# Patient Record
Sex: Female | Born: 1990 | Race: Black or African American | Hispanic: No | Marital: Single
Health system: Midwestern US, Community
[De-identification: ages and names within clinical notes are randomized; demographics above are authoritative.]

## PROBLEM LIST (undated history)

## (undated) DIAGNOSIS — R634 Abnormal weight loss: Secondary | ICD-10-CM

## (undated) DIAGNOSIS — R1013 Epigastric pain: Secondary | ICD-10-CM

## (undated) DIAGNOSIS — D649 Anemia, unspecified: Secondary | ICD-10-CM

## (undated) DIAGNOSIS — K589 Irritable bowel syndrome without diarrhea: Secondary | ICD-10-CM

## (undated) HISTORY — PX: CHOLECYSTECTOMY: SHX55

## (undated) HISTORY — PX: OTHER SURGICAL HISTORY: SHX169

---

## 2010-12-05 LAB — URINALYSIS W/ RFLX MICROSCOPIC
Bilirubin: NEGATIVE
Blood: NEGATIVE
Glucose: NEGATIVE MG/DL
Nitrites: POSITIVE — AB
Protein: NEGATIVE MG/DL
Specific gravity: >1.03 — ABNORMAL HIGH (ref 1.003–1.030)
Urobilinogen: 0.2 EU/DL (ref 0.2–1.0)
pH (UA): 6 (ref 5.0–8.0)

## 2010-12-05 LAB — CBC WITH AUTOMATED DIFF
ABS. BASOPHILS: 0 10*3/uL (ref 0.0–0.06)
ABS. EOSINOPHILS: 0.3 10*3/uL (ref 0.0–0.4)
ABS. LYMPHOCYTES: 0.8 10*3/uL — ABNORMAL LOW (ref 0.9–3.6)
ABS. MONOCYTES: 0.6 10*3/uL (ref 0.05–1.2)
ABS. NEUTROPHILS: 5.2 10*3/uL (ref 1.8–8.0)
BASOPHILS: 0 % (ref 0–2)
EOSINOPHILS: 5 % (ref 0–5)
HCT: 28.8 % — ABNORMAL LOW (ref 35.0–45.0)
HGB: 8.8 g/dL — ABNORMAL LOW (ref 12.0–16.0)
LYMPHOCYTES: 11 % — ABNORMAL LOW (ref 21–52)
MCH: 19.1 PG — ABNORMAL LOW (ref 24.0–34.0)
MCHC: 30.6 g/dL — ABNORMAL LOW (ref 31.0–37.0)
MCV: 62.6 FL — ABNORMAL LOW (ref 74.0–97.0)
MONOCYTES: 8 % (ref 3–10)
MPV: 9 FL — ABNORMAL LOW (ref 9.2–11.8)
NEUTROPHILS: 76 % — ABNORMAL HIGH (ref 40–73)
PLATELET COMMENTS: INCREASED
PLATELET: 409 10*3/uL (ref 135–420)
RBC: 4.6 M/uL (ref 4.20–5.30)
RDW: 19 % — ABNORMAL HIGH (ref 11.6–14.5)
WBC: 6.9 10*3/uL (ref 4.6–13.2)

## 2010-12-05 LAB — METABOLIC PANEL, BASIC
Anion gap: 5 mmol/L (ref 5–15)
BUN/Creatinine ratio: 9 — ABNORMAL LOW (ref 12–20)
BUN: 7 MG/DL (ref 7–18)
CO2: 27 MMOL/L (ref 21–32)
Calcium: 8.9 MG/DL (ref 8.4–10.4)
Chloride: 103 MMOL/L (ref 100–108)
Creatinine: 0.8 MG/DL (ref 0.6–1.3)
GFR est AA: >60 mL/min/{1.73_m2} (ref >60)
GFR est non-AA: >60 mL/min/{1.73_m2} (ref >60)
Glucose: 88 MG/DL (ref 74–99)
Potassium: 3.7 MMOL/L (ref 3.5–5.5)
Sodium: 135 MMOL/L — ABNORMAL LOW (ref 136–145)

## 2010-12-05 LAB — HEPATIC FUNCTION PANEL
A-G Ratio: 0.6 — ABNORMAL LOW (ref 0.8–1.7)
ALT (SGPT): 20 U/L — ABNORMAL LOW (ref 30–65)
AST (SGOT): 14 U/L — ABNORMAL LOW (ref 15–37)
Albumin: 3.3 g/dL — ABNORMAL LOW (ref 3.4–5.0)
Alk. phosphatase: 92 U/L (ref 50–136)
Bilirubin, direct: 0.2 MG/DL (ref 0.0–0.2)
Bilirubin, total: 0.4 MG/DL (ref 0.2–1.0)
Globulin: 5.5 g/dL — ABNORMAL HIGH (ref 2.0–4.0)
Protein, total: 8.8 g/dL — ABNORMAL HIGH (ref 6.4–8.2)

## 2010-12-05 LAB — URINE MICROSCOPIC ONLY
RBC: 0 /HPF (ref 0–5)
WBC: 11 /HPF (ref 0–4)

## 2010-12-05 LAB — HCG URINE, QL: HCG urine, QL: NEGATIVE

## 2010-12-05 LAB — LIPASE: Lipase: 62 U/L — ABNORMAL LOW (ref 73–393)

## 2010-12-07 LAB — CULTURE, URINE
Culture result:: >100000
Culture: >100000

## 2011-02-02 ENCOUNTER — Encounter

## 2011-02-15 LAB — METABOLIC PANEL, BASIC
Anion gap: 3 mmol/L — ABNORMAL LOW (ref 5–15)
BUN/Creatinine ratio: 14 (ref 12–20)
BUN: 10 MG/DL (ref 7–18)
CO2: 30 MMOL/L (ref 21–32)
Calcium: 8.9 MG/DL (ref 8.4–10.4)
Chloride: 103 MMOL/L (ref 100–108)
Creatinine: 0.7 MG/DL (ref 0.6–1.3)
GFR est AA: >60 mL/min/{1.73_m2} (ref >60)
GFR est non-AA: >60 mL/min/{1.73_m2} (ref >60)
Glucose: 77 MG/DL (ref 74–99)
Potassium: 4.9 MMOL/L (ref 3.5–5.5)
Sodium: 136 MMOL/L (ref 136–145)

## 2011-02-15 LAB — CBC W/O DIFF
HCT: 28.3 % — ABNORMAL LOW (ref 35.0–45.0)
HGB: 8.4 g/dL — ABNORMAL LOW (ref 12.0–16.0)
MCH: 18.9 PG — ABNORMAL LOW (ref 24.0–34.0)
MCHC: 29.7 g/dL — ABNORMAL LOW (ref 31.0–37.0)
MCV: 63.7 FL — ABNORMAL LOW (ref 74.0–97.0)
MPV: 9.3 FL (ref 9.2–11.8)
PLATELET: 419 10*3/uL (ref 135–420)
RBC: 4.44 M/uL (ref 4.20–5.30)
RDW: 20.3 % — ABNORMAL HIGH (ref 11.6–14.5)
WBC: 4.7 10*3/uL (ref 4.6–13.2)

## 2011-03-02 LAB — METABOLIC PANEL, BASIC
Anion gap: 7 mmol/L (ref 5–15)
BUN/Creatinine ratio: 15 (ref 12–20)
BUN: 12 MG/DL (ref 7–18)
CO2: 30 MMOL/L (ref 21–32)
Calcium: 9 MG/DL (ref 8.4–10.4)
Chloride: 101 MMOL/L (ref 100–108)
Creatinine: 0.8 MG/DL (ref 0.6–1.3)
GFR est AA: >60 mL/min/{1.73_m2} (ref >60)
GFR est non-AA: >60 mL/min/{1.73_m2} (ref >60)
Glucose: 74 MG/DL (ref 74–99)
Potassium: 4.7 MMOL/L (ref 3.5–5.5)
Sodium: 138 MMOL/L (ref 136–145)

## 2011-03-02 LAB — CBC W/O DIFF
HCT: 28.6 % — ABNORMAL LOW (ref 35.0–45.0)
HGB: 8.4 g/dL — ABNORMAL LOW (ref 12.0–16.0)
MCH: 19.1 PG — ABNORMAL LOW (ref 24.0–34.0)
MCHC: 29.4 g/dL — ABNORMAL LOW (ref 31.0–37.0)
MCV: 65.1 FL — ABNORMAL LOW (ref 74.0–97.0)
MPV: 9.4 FL (ref 9.2–11.8)
PLATELET: 397 10*3/uL (ref 135–420)
RBC: 4.39 M/uL (ref 4.20–5.30)
RDW: 20.9 % — ABNORMAL HIGH (ref 11.6–14.5)
WBC: 4.7 10*3/uL (ref 4.6–13.2)

## 2011-03-02 NOTE — H&P (Unsigned)
[facility]                               [address] [place], [place] [address]                                   PRE-ADMISSION                             HISTORY AND PHYSICAL    PATIENT:    Meredith Lewis, Meredith Lewis  MRN:           [id number]     DATE:   03/03/2011  BILLING:       [id number]    ROOM:   Milas Kocher  DICTATING:  Kaelee Pfeffer G. Aloise Copus, MD      CHIEF COMPLAINT: Mid epigastric pain since December of 2010.    HISTORY OF PRESENT ILLNESS: This 20 year old patient presents with  epigastric and mid epigastric pain since December 2010, coming on and off,  described as burning, no radiation and worse with any kind of food,  ________nausea and vomiting and occurring mostly postprandially. The  patient has  been worked up by Dr. Reesa Chew and so far no documentation of  GI abnormality and gallbladder ultrasound showed gallstones. There is no  fever or jaundice. A HIDA scan showed a 22% ejection fraction. A diagnosis  of chronic cholecystitis and cholelithiasis, symptomatic, was made and the  patient was referred for surgical removal of her gallbladder. A single  incision laparoscopic cholecystectomy was discussed with her with possible  risks and complications not to exclude bleeding, possible bile duct injury,  possible bowel injury, CO2 insufflation complication, possible conversion  to a standard laparoscopic procedure or open cholecystectomy. She  understood and agreed to proceed.    ALLERGIES: NONE TO ANY DRUGS.    PAST MEDICAL HISTORY: Denies diabetes mellitus, no hypertension, no seizure  history, no asthma, bleeding history.    SOCIAL HISTORY: No alcohol or smoking history.    MEDICATIONS: None.    OCCUPATION: A Archivist at [employer].    FAMILY HISTORY: Denies any gallbladder disease in her family.    PHYSICAL EXAMINATION  GENERAL: The patient is normally developed, fairly nourished, without any   respiratory distress. She is alert, awake and oriented.  HEENT: Within normal limits. ________.  NECK: Supple. No adenopathy felt. No thyromegaly.  CHEST: Clear anteriorly and posteriorly to both sides, right and left.  HEART: Regular rate and rhythm.  ABDOMEN: Flat and soft. There is mild pain and tenderness in the right  upper quadrant. No organomegaly. Bowel sounds are present.  EXTREMITIES: Shows no motor or sensory deficit.    IMPRESSION: Chronic cholecystitis with cholelithiasis.    PLAN: As discussed above.                          Date:______Time:______Signature________________________________          Arlice Colt. Sharmon Revere, MD    RGP:wmx  D: 03/02/2011  11:22 P  T: 03/02/2011  11:51 P  Job: [id number]  CScriptDoc #: [id number]    cc:    Philemon Kingdom, MD  Rannie Craney G. Sharmon Revere, MD

## 2011-03-03 LAB — TYPE & SCREEN
ABO/Rh(D): O POS
Antibody screen: NEGATIVE

## 2011-03-03 LAB — TYPE AND SCREEN
ABO/Rh: O POS
Antibody Screen: NEGATIVE

## 2011-03-04 NOTE — Op Note (Unsigned)
[facility]                               [address] [place], [place] [address]                                 OPERATIVE REPORT    PATIENT:     Meredith Lewis, Meredith Lewis  MRN:            [id number]    DATE:   03/03/2011  BILLING:     [id number]  ROOM:        Milas Kocher  ATTENDING:   Arlice Colt. Sharmon Revere, MD  DICTATING:   Sriansh Farra G. Rashawn Rolon, MD      PROCEDURE: Single incision laparoscopic cholecystectomy.    PREOPERATIVE DIAGNOSIS: Chronic cholecystitis and cholelithiasis,  symptomatic.    POSTOPERATIVE DIAGNOSIS: Chronic cholecystitis and cholelithiasis,  symptomatic. Pending pathology.    FINDINGS: Presence of a distended gallbladder, partially intrahepatic, with  multiple stones/black stones inside the gallbladder.    COUNTS: Correct.    ANESTHESIA: General given by nurse anesthetist under Dr. Sedonia Small  supervision and Marcaine 0.25% local infiltration.    DESCRIPTION OF PROCEDURE: With the patient in supine position under general  anesthesia, a timeout was called recording the patient's name, surgeon  performing the operation, the procedure being done, allergies and  medications, and everybody concurred and agreed to proceed. The abdomen was  then prepped with Betadine and draped in sterile fashion. Marcaine was  infiltrated into the navel.    An incision was made longitudinally, releasing the navel from the natural  orifice. A suture was then placed on both sides of natural orifice using 0  Vicryl on a UR needle and the natural orifice was extended to about 2.5 cm.  Through this opening, the Alexis portion of the GelPort was introduced. The  outer plastic ring was folded inwards, flush to the skin. Plastic trough  was placed in the gel portion was placed on top with two 10-mm ports on the  left, one on the right and one in the middle. CO2 insufflation was then  started. A sealed camera was placed in the right lateral port. Grasping the   gallbladder from the dome, it was then evident that the gallbladder was  partially intrahepatic. Another grasper was placed at the body of the  gallbladder and with careful dissection the cystic duct was exposed,  releasing leaflet of Calot with the cystic artery.    The cystic artery was divided as it entered the gallbladder using Harmonic  scalpel at level 2 with excellent division and coagulation and needed more  dissection. The cystic duct was exposed well and this was divided as it  leaves the gallbladder using Harmonic scalpel at level 2.    The gallbladder was then further dissected some more to gain good exposure  of the cystic duct and cystic duct area.  The cystic duct was then endolooped with 2-0 PDS and the suture was divided  long. The gallbladder was then put in slight traction was excised from the  gallbladder bed using a Harmonic scalpel at level 2 and level 5.    The gallbladder wall was excised completely without any evidence of oozing  or  bleeding in the gallbladder bed region. The area was irrigated and all  fluid used to irrigate was suctioned out.    The gallbladder was then removed the incision itself without much  difficulty. The CO2 was stopped and allowed to escape. The wound was  infiltrated with Marcaine 0.5% plain, followed by closure of the wound with  ________ in figure-of-eight manner and the navel was then sutured back to  the rectus fascia with 2-0 Vicryl and followed by bacitracin, 2 x 2 and  Tegaderm. The patient has tolerated the procedure well.                        Date:______Time:______Signature________________________________          Arlice Colt. Sharmon Revere, MD    RGP:wmx  D: 03/03/2011 T: 03/04/2011  3:46 A  Job: [id number]  CScriptDoc #: [id number]    cc:     Caroleen Hamman, MD          Ella Golomb G. Sharmon Revere, MD

## 2011-05-31 ENCOUNTER — Encounter

## 2011-06-08 MED ORDER — BARIUM SULFATE 96 % (W/W) ORAL SUSP, RECON
96 % (w/w) | Freq: Once | ORAL | Status: AC
Start: 2011-06-08 — End: 2011-06-08
  Administered 2011-06-08: 11:00:00 via ORAL

## 2011-06-16 ENCOUNTER — Encounter

## 2011-06-21 ENCOUNTER — Encounter

## 2011-06-21 MED ORDER — IOVERSOL 320 MG/ML IV SOLN
320 mg iodine/mL | Freq: Once | INTRAVENOUS | Status: AC
Start: 2011-06-21 — End: 2011-06-21
  Administered 2011-06-21: 15:00:00 via INTRAVENOUS

## 2011-08-20 MED ADMIN — diphenhydrAMINE (BENADRYL) capsule 25 mg: ORAL | @ 15:00:00 | NDC 00904530661

## 2011-08-20 MED ADMIN — sodium chloride (NS) flush 5-10 mL: INTRAVENOUS | @ 15:00:00 | NDC 87701099893

## 2011-08-20 MED ADMIN — acetaminophen (TYLENOL) tablet 650 mg: ORAL | @ 15:00:00 | NDC 50580050130

## 2011-08-20 MED ADMIN — 0.9% sodium chloride infusion 250 mL: INTRAVENOUS | @ 15:00:00 | NDC 00409798302

## 2011-08-20 MED ADMIN — sodium chloride (NS) flush 5-10 mL: INTRAVENOUS | @ 20:00:00 | NDC 87701099893

## 2011-08-20 NOTE — Progress Notes (Signed)
[facility] Progress Note    Date: August 20, 2011    Name: Meredith Lewis    MRN: [id number]         DOB: 01-Jan-1991      Patient assessed and education was provided.     Patient vitals were reviewed.  Visit Vitals   Item Reading   ??? BP 112/75   ??? Pulse 67   ??? Temp 98.2 ??F (36.8 ??C)   ??? Resp 18   ??? SpO2 100%       Pre-medications were administered as ordered and transfusion  was initiated.       Patient  tolerated  transfusion, and has no complaints at this time. Discharge instruction was given and patient     verbalized understanding.    [facility] at  1500.       Lanny Cramp  August 20, 2011  2:55 PM

## 2011-08-21 LAB — TYPE + CROSSMATCH
ABO/Rh(D): O POS
Antibody screen: NEGATIVE
Status of unit: TRANSFUSED
Status of unit: TRANSFUSED
Unit division: 0
Unit division: 0

## 2013-12-28 LAB — URINALYSIS W/ RFLX MICROSCOPIC
Bilirubin: NEGATIVE
Blood: NEGATIVE
Glucose: NEGATIVE mg/dL
Ketone: NEGATIVE mg/dL
Leukocyte Esterase: NEGATIVE
Nitrites: NEGATIVE
Protein: 30 mg/dL — AB
Specific gravity: >1.03 — ABNORMAL HIGH (ref 1.003–1.030)
Urobilinogen: 0.2 EU/dL (ref 0.2–1.0)
pH (UA): 5.5 (ref 5.0–8.0)

## 2013-12-28 LAB — CBC WITH AUTOMATED DIFF
ABS. BASOPHILS: 0 10*3/uL (ref 0.0–0.1)
ABS. EOSINOPHILS: 0.1 10*3/uL (ref 0.0–0.4)
ABS. LYMPHOCYTES: 1.7 10*3/uL (ref 0.9–3.6)
ABS. MONOCYTES: 0.3 10*3/uL (ref 0.05–1.2)
ABS. NEUTROPHILS: 4.1 10*3/uL (ref 1.8–8.0)
BASOPHILS: 1 % (ref 0–2)
EOSINOPHILS: 1 % (ref 0–5)
HCT: 28.7 % — ABNORMAL LOW (ref 35.0–45.0)
HGB: 9.3 g/dL — ABNORMAL LOW (ref 12.0–16.0)
LYMPHOCYTES: 27 % (ref 21–52)
MCH: 26.6 PG (ref 24.0–34.0)
MCHC: 32.4 g/dL (ref 31.0–37.0)
MCV: 82.2 FL (ref 74.0–97.0)
MONOCYTES: 4 % (ref 3–10)
MPV: 10.1 FL (ref 9.2–11.8)
NEUTROPHILS: 67 % (ref 40–73)
PLATELET: 321 10*3/uL (ref 135–420)
RBC: 3.49 M/uL — ABNORMAL LOW (ref 4.20–5.30)
RDW: 14.3 % (ref 11.6–14.5)
WBC: 6.1 10*3/uL (ref 4.6–13.2)

## 2013-12-28 LAB — URINE MICROSCOPIC ONLY
Bacteria: NEGATIVE /hpf
RBC: NEGATIVE /hpf (ref 0–5)
WBC: 4 /hpf (ref 0–4)

## 2013-12-28 LAB — TYPE & SCREEN
ABO/Rh(D): O POS
Antibody screen: NEGATIVE

## 2013-12-28 LAB — METABOLIC PANEL, BASIC
Anion gap: 9 mmol/L (ref 3.0–18)
BUN/Creatinine ratio: 13 (ref 12–20)
BUN: 12 MG/DL (ref 7.0–18)
CO2: 23 mmol/L (ref 21–32)
Calcium: 8.5 MG/DL (ref 8.5–10.1)
Chloride: 103 mmol/L (ref 100–108)
Creatinine: 0.9 MG/DL (ref 0.6–1.3)
GFR est AA: >60 mL/min/{1.73_m2} (ref >60)
GFR est non-AA: >60 mL/min/{1.73_m2} (ref >60)
Glucose: 139 mg/dL — ABNORMAL HIGH (ref 74–99)
Potassium: 3.6 mmol/L (ref 3.5–5.5)
Sodium: 135 mmol/L — ABNORMAL LOW (ref 136–145)

## 2013-12-28 LAB — PTT: aPTT: 25.2 s (ref 24.6–37.7)

## 2013-12-28 LAB — PROTHROMBIN TIME + INR
INR: 1 (ref 0.8–1.2)
Prothrombin time: 13.6 s (ref 11.5–15.2)

## 2013-12-28 LAB — BETA HCG, QT
Beta HCG, QT: 979 m[IU]/mL — ABNORMAL HIGH (ref 0–10)
hCG Quant: 979 m[IU]/mL — ABNORMAL HIGH (ref 0–10)

## 2013-12-28 LAB — TYPE AND SCREEN
ABO/Rh: O POS
Antibody Screen: NEGATIVE

## 2013-12-28 MED ADMIN — sodium chloride 0.9 % bolus infusion 1,000 mL: INTRAVENOUS | @ 08:00:00 | NDC 00409798309

## 2013-12-28 MED FILL — SODIUM CHLORIDE 0.9 % IV: INTRAVENOUS | Qty: 1000

## 2013-12-28 NOTE — ED Provider Notes (Signed)
HPI Comments: Meredith Lewis is a 23 y.o. female who presents to the ED complaining of vaginal bleeding. Patient states she had an elective Ab 2 weeks ago and she started experiencing heavy vaginal bleeding with clots approx 5 hours ago.  She denies any vaginal trauma or intercourse, no unusual activities.  She also notes associated lightheadedness.  She denies any abdominal pain, no chest pain, no palpitations, no dyspnea, no diaphoresis, no syncope.  She missed her follow up appointment on Monday.  [facility] [place] she states she does not have a gynecologist.  She notes she has had some "light" vaginal bleeding since procedure, less significant than her current bleeding.  She denies any history of anemia or other medical problems. No further symptoms or complaints were expressed.     Patient is a 23 y.o. female presenting with pregnancy problem.   Pregnancy Problem         No past medical history on file.     Past Surgical History   Procedure Laterality Date   ??? Hx cholecystectomy           No family history on file.     History     Social History   ??? Marital Status: SINGLE     Spouse Name: N/A     Number of Children: N/A   ??? Years of Education: N/A     Occupational History   ??? Not on file.     Social History Main Topics   ??? Smoking status: Never Smoker    ??? Smokeless tobacco: Not on file   ??? Alcohol Use: Not on file   ??? Drug Use: Not on file   ??? Sexual Activity: Not on file     Other Topics Concern   ??? Not on file     Social History Narrative                  ALLERGIES: Pollen extracts      Review of Systems   Constitutional: Negative.    HENT: Negative.    Eyes: Negative.    Respiratory: Negative.    Cardiovascular: Negative.    Gastrointestinal: Negative.    Endocrine: Negative.    Genitourinary: Positive for vaginal bleeding.   Musculoskeletal: Negative.    Skin: Negative.    Allergic/Immunologic: Negative.    Neurological: Negative.    Hematological: Negative.     Psychiatric/Behavioral: Negative.    All other systems reviewed and are negative.      Filed Vitals:    12/28/13 0232   BP: 83/59   Pulse: 76   Temp: 97.6 ??F (36.4 ??C)   Resp: 18   Weight: 63.504 kg (140 lb)   SpO2: 100%            Physical Exam   Constitutional: She is oriented to person, place, and time. She appears well-developed and well-nourished. No distress.   Resting comfortably on stretcher   HENT:   Head: Normocephalic and atraumatic.   MM moist   Eyes: Conjunctivae and EOM are normal. No scleral icterus.   Sclera clear bilaterally   Neck: Normal range of motion. Neck supple. No JVD present.   Non-tender to palpation   Cardiovascular: Normal rate, regular rhythm and normal heart sounds.  Exam reveals no gallop and no friction rub.    No murmur heard.  Pulmonary/Chest: Effort normal and breath sounds normal. No respiratory distress. She has no wheezes. She  has no rales. She exhibits no tenderness.   No crepitance with palpation   Abdominal: Soft. Bowel sounds are normal. She exhibits no distension. There is no tenderness. There is no rebound and no guarding.   Genitourinary:   No CVA tenderness   Musculoskeletal: She exhibits no edema or tenderness.   Normal inspection of upper extremities.  No edema noted to bilateral lower extremities   Lymphadenopathy:     She has no cervical adenopathy.   Neurological: She is alert and oriented to person, place, and time. No cranial nerve deficit. She exhibits normal muscle tone.   Normal motor and sensation bilaterally to upper and lower extremities   Skin: Skin is warm and dry. No rash noted. She is not diaphoretic.   Psychiatric:   Normal mood and affect.     Vitals reviewed.       MDM  Number of Diagnoses or Management Options  Dehydration:   Lightheadedness:   Vaginal bleeding:   Diagnosis management comments: Pt with lightheadedness after increased vaginal bleeding.  Benign abdominal exam and reassuring VS.  Will check labs and reassess.    Pt resting  comfortably.  BP improved.  No significant anemia noted.  + dehydration on UA.  Will give second L and ambulate.  Advised follow up with gyn until quant resolves.  She agrees with discharge to home.       Amount and/or Complexity of Data Reviewed  Clinical lab tests: ordered and reviewed  Decide to obtain previous medical records or to obtain history from someone other than the patient: yes  Obtain history from someone other than the patient: yes  Independent visualization of images, tracings, or specimens: yes    Risk of Complications, Morbidity, and/or Mortality  Presenting problems: moderate  Management options: moderate    Patient Progress  Patient progress: improved      Pelvic Exam  Date/Time: 12/28/2013 3:25 AM  Performed by: attending  Type of exam performed: speculum.    Comments: Pt with normal vaginal mucosa.  Bleeding and clots noted to vaginal vault.  Clots through cervical os.  Cervix without laceration, pink.          Scribe Attestation:   December 28, 2013 at 2:57 AM - Su Monksachel N Anglin scribing for and in the presence of Dr.Cystal Shannahan Lenord CarboE Vishal Sandlin, MD     Odie Seraachel Anglin, Scribe      I have reviewed the information recorded by the scribe in my presence and agree with its contents. Dr. Matilde Basharty Rameen Quinney, M.D.  (PROVIDER ATTESTATION)

## 2013-12-28 NOTE — ED Notes (Signed)
Pt ambulatory with steady gait. #2 IVF 1 liter [facility] and [facility]

## 2013-12-28 NOTE — ED Notes (Signed)
To ed bed 7 from triage

## 2013-12-28 NOTE — ED Notes (Signed)
IVF infusing and will ambulate and d/c as fluids finish

## 2013-12-28 NOTE — ED Notes (Signed)
Pt moved to ed bed 17

## 2013-12-28 NOTE — ED Notes (Signed)
Sbar received from [employer] RN regarding pt.

## 2013-12-28 NOTE — ED Notes (Signed)
I have reviewed discharge instructions with the patient.  The patient verbalized understanding.

## 2013-12-28 NOTE — ED Notes (Signed)
Initial 1 liter NSS infused and absorbed and second liter up to infuse at wo rate, site RAC asymptomatic. Will try to ambulate pt after second liter infused. Pt notes cramping has subsided. Pt notes bleeding has decreased.

## 2013-12-28 NOTE — ED Notes (Signed)
Pt calls and has completely saturated peri pad , no clots noted , peri pad changed and pt c/o severe cramps.

## 2013-12-28 NOTE — ED Notes (Signed)
Pt states she had a surgical abortion 2 weeks ago, has been severely bleeding for the past 5 hours and passing large clots

## 2013-12-28 NOTE — ED Notes (Addendum)
Pt given cleansing cloths and peri pad and absorbent pad removed and large amount bright blood ,no clots noted. Pt given warm blankets

## 2015-05-03 ENCOUNTER — Ambulatory Visit (INDEPENDENT_AMBULATORY_CARE_PROVIDER_SITE_OTHER): Payer: BLUE CROSS/BLUE SHIELD | Admitting: Physician Assistant

## 2015-05-03 VITALS — BP 104/62 | HR 102 | Temp 99.1°F | Ht 65.0 in | Wt 122.8 lb

## 2015-05-03 DIAGNOSIS — N390 Urinary tract infection, site not specified: Secondary | ICD-10-CM | POA: Diagnosis not present

## 2015-05-03 DIAGNOSIS — R300 Dysuria: Secondary | ICD-10-CM | POA: Diagnosis not present

## 2015-05-03 DIAGNOSIS — N3001 Acute cystitis with hematuria: Secondary | ICD-10-CM | POA: Diagnosis not present

## 2015-05-03 DIAGNOSIS — K649 Unspecified hemorrhoids: Secondary | ICD-10-CM

## 2015-05-03 DIAGNOSIS — K5902 Outlet dysfunction constipation: Secondary | ICD-10-CM

## 2015-05-03 LAB — POCT UA - MICROSCOPIC ONLY
CASTS, UR, LPF, POC: NEGATIVE
Crystals, Ur, HPF, POC: NEGATIVE
Yeast, UA: NEGATIVE

## 2015-05-03 LAB — POCT URINALYSIS DIPSTICK
Bilirubin, UA: NEGATIVE
GLUCOSE UA: NEGATIVE
Ketones, UA: NEGATIVE
NITRITE UA: NEGATIVE
PH UA: 6
PROTEIN UA: NEGATIVE
Spec Grav, UA: 1.015
UROBILINOGEN UA: 1

## 2015-05-03 MED ORDER — SULFAMETHOXAZOLE-TRIMETHOPRIM 800-160 MG PO TABS: 1.0000 | ORAL_TABLET | Freq: Two times a day (BID) | ORAL | Status: AC

## 2015-05-03 MED ORDER — HYDROCORTISONE ACETATE 25 MG RE SUPP: 25.0000 mg | Freq: Two times a day (BID) | RECTAL | Status: AC

## 2015-05-03 NOTE — Patient Instructions (Signed)
Please increase your fiber intake. Continue to use the miralax at this time.   Urinary Tract Infection Urinary tract infections (UTIs) can develop anywhere along your urinary tract. Your urinary tract is your body's drainage system for removing wastes and extra water. Your urinary tract includes two kidneys, two ureters, a bladder, and a urethra. Your kidneys are a pair of bean-shaped organs. Each kidney is about the size of your fist. They are located below your ribs, one on each side of your spine. CAUSES Infections are caused by microbes, which are microscopic organisms, including fungi, viruses, and bacteria. These organisms are so small that they can only be seen through a microscope. Bacteria are the microbes that most commonly cause UTIs. SYMPTOMS  Symptoms of UTIs may vary by age and gender of the patient and by the location of the infection. Symptoms in young women typically include a frequent and intense urge to urinate and a painful, burning feeling in the bladder or urethra during urination. Older women and men are more likely to be tired, shaky, and weak and have muscle aches and abdominal pain. A fever may mean the infection is in your kidneys. Other symptoms of a kidney infection include pain in your back or sides below the ribs, nausea, and vomiting. DIAGNOSIS To diagnose a UTI, your caregiver will ask you about your symptoms. Your caregiver also will ask to provide a urine sample. The urine sample will be tested for bacteria and white blood cells. White blood cells are made by your body to help fight infection. TREATMENT  Typically, UTIs can be treated with medication. Because most UTIs are caused by a bacterial infection, they usually can be treated with the use of antibiotics. The choice of antibiotic and length of treatment depend on your symptoms and the type of bacteria causing your infection. HOME CARE INSTRUCTIONS  If you were prescribed antibiotics, take them exactly as your  caregiver instructs you. Finish the medication even if you feel better after you have only taken some of the medication.  Drink enough water and fluids to keep your urine clear or pale yellow.  Avoid caffeine, tea, and carbonated beverages. They tend to irritate your bladder.  Empty your bladder often. Avoid holding urine for long periods of time.  Empty your bladder before and after sexual intercourse.  After a bowel movement, women should cleanse from front to back. Use each tissue only once. SEEK MEDICAL CARE IF:   You have back pain.  You develop a fever.  Your symptoms do not begin to resolve within 3 days. SEEK IMMEDIATE MEDICAL CARE IF:   You have severe back pain or lower abdominal pain.  You develop chills.  You have nausea or vomiting.  You have continued burning or discomfort with urination. MAKE SURE YOU:   Understand these instructions.  Will watch your condition.  Will get help right away if you are not doing well or get worse. Document Released: 06/02/2005 Document Revised: 02/22/2012 Document Reviewed: 10/01/2011 [facility] Patient Information 2015 [place], [place]. This information is not intended to replace advice given to you by your health care provider. Make sure you discuss any questions you have with your health care provider.

## 2015-05-03 NOTE — Progress Notes (Signed)
[facility] [address] [place] [address] [phone]  Date:  05/03/2015   Name:  Tammy Goodwin   DOB:  1991-05-22   MRN:  [id number]  PCP:  No primary care provider on file.    History of Present Illness:  Tammy Goodwin is a 24 y.o. female patient who presents to [facility] for chief complaint of rectal pain and dysuria for 6 days.  Patient first noticed the constipation, though this is not new to her.  The next day she had burning sensation with urination.  No fever, abdominal pain, nausea, vomiting, frequency, or hematuria.  No abnormal vaginal discharge or bleeding.  Bad diet without vegetables or fiber intake.  She generally uses miralax and dulcolax with relief.  She has hx of GI constipation (2012) and was ruled to have inflammatory condition and a small exiting small intestine (2014).  Has delayed procedure with GI surgeon until graduation of May, 2017.  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  Not sexually active for 8 months.        There are no active problems to display for this patient.   History reviewed. No pertinent past medical history.  History reviewed. No pertinent past surgical history.  Social History  Substance Use Topics  . Smoking status: Never Smoker   . Smokeless tobacco: None  . Alcohol Use: None    History reviewed. No pertinent family history.  No Known Allergies  Medication list has been reviewed and updated.  No current outpatient prescriptions on file prior to visit.   No current facility-administered medications on file prior to visit.    ROS ROS otherwise unremarkable unless listed above.   Physical Examination: BP 104/62 mmHg  Pulse 102  Temp(Src) 99.1 F (37.3 C) (Oral)  Ht 5\' 5"  (1.651 m)  Wt 122 lb 12.8 oz (55.702 kg)  BMI 20.44 kg/m2  SpO2 98%  LMP 05/01/2015 Ideal Body Weight: Weight in (lb) to have BMI = 25: 149.9  Physical Exam  Constitutional: She appears well-developed and  well-nourished.  HENT:  Head: Normocephalic and atraumatic.  Eyes: Pupils are equal, round, and reactive to light.  Cardiovascular: Normal rate, regular rhythm and normal heart sounds.   No murmur heard. Pulmonary/Chest: Effort normal and breath sounds normal. No respiratory distress. She has no wheezes.  Abdominal: Soft. Bowel sounds are normal. There is no hepatosplenomegaly. There is tenderness (mild) in the suprapubic area. There is no CVA tenderness.  Genitourinary: Rectal exam shows external hemorrhoid (Posterior external non-thrombosed). Rectal exam shows no fissure, no mass and no tenderness.    Results for orders placed or performed in visit on 05/03/15  POCT UA - Microscopic Only  Result Value Ref Range   WBC, Ur, HPF, POC 10-20    RBC, urine, microscopic 3-5    Bacteria, U Microscopic 1+    Mucus, UA 1+    Epithelial cells, urine per micros 4-8    Crystals, Ur, HPF, POC neg    Casts, Ur, LPF, POC neg    Yeast, UA neg   POCT urinalysis dipstick  Result Value Ref Range   Color, UA amber    Clarity, UA cloudy    Glucose, UA neg    Bilirubin, UA neg    Ketones, UA neg    Spec Grav, UA 1.015    Blood, UA large    pH, UA 6.0    Protein, UA neg    Urobilinogen, UA  1.0    Nitrite, UA neg    Leukocytes, UA small (1+) (A) Negative     Assessment and Plan: 24 year old female is here today for chief complaint of dysuria, and rectal pain. Hx of constipation.  Advised to continue miralax.  Also advised to increase fiber and vegetable intake.   -Starting bactrim, sending for urine culture at this time.  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

## 2015-05-04 LAB — URINE CULTURE: Colony Count: >=100000

## 2015-05-15 ENCOUNTER — Encounter (HOSPITAL_COMMUNITY): Payer: Self-pay | Admitting: *Deleted

## 2015-05-15 ENCOUNTER — Emergency Department (HOSPITAL_COMMUNITY)
Admission: EM | Admit: 2015-05-15 | Discharge: 2015-05-15 | Disposition: A | Discharge status: Home / Self Care | Payer: BLUE CROSS/BLUE SHIELD | Attending: Emergency Medicine | Admitting: Emergency Medicine

## 2015-05-15 ENCOUNTER — Emergency Department (HOSPITAL_COMMUNITY): Payer: BLUE CROSS/BLUE SHIELD

## 2015-05-15 DIAGNOSIS — R61 Generalized hyperhidrosis: Secondary | ICD-10-CM | POA: Insufficient documentation

## 2015-05-15 DIAGNOSIS — R000 Tachycardia, unspecified: Secondary | ICD-10-CM | POA: Diagnosis not present

## 2015-05-15 DIAGNOSIS — Z79899 Other long term (current) drug therapy: Secondary | ICD-10-CM | POA: Insufficient documentation

## 2015-05-15 DIAGNOSIS — D508 Other iron deficiency anemias: Secondary | ICD-10-CM | POA: Diagnosis not present

## 2015-05-15 DIAGNOSIS — M791 Myalgia: Secondary | ICD-10-CM | POA: Insufficient documentation

## 2015-05-15 DIAGNOSIS — R0789 Other chest pain: Secondary | ICD-10-CM | POA: Insufficient documentation

## 2015-05-15 DIAGNOSIS — R079 Chest pain, unspecified: Secondary | ICD-10-CM | POA: Diagnosis present

## 2015-05-15 HISTORY — DX: Anemia, unspecified: D64.9

## 2015-05-15 HISTORY — DX: Irritable bowel syndrome without diarrhea: K58.9

## 2015-05-15 LAB — CBC
HCT: 25.8 % — ABNORMAL LOW (ref 36.0–46.0)
Hemoglobin: 7.9 g/dL — ABNORMAL LOW (ref 12.0–15.0)
MCH: 21.7 pg — ABNORMAL LOW (ref 26.0–34.0)
MCHC: 30.6 g/dL (ref 30.0–36.0)
MCV: 70.9 fL — AB (ref 78.0–100.0)
PLATELETS: 527 10*3/uL — AB (ref 150–400)
RBC: 3.64 MIL/uL — AB (ref 3.87–5.11)
RDW: 17.1 % — ABNORMAL HIGH (ref 11.5–15.5)
WBC: 12 10*3/uL — AB (ref 4.0–10.5)

## 2015-05-15 LAB — POC OCCULT BLOOD, ED: Fecal Occult Bld: NEGATIVE

## 2015-05-15 LAB — BASIC METABOLIC PANEL
Anion gap: 10 (ref 5–15)
BUN: <5 mg/dL — ABNORMAL LOW (ref 6–20)
CHLORIDE: 101 mmol/L (ref 101–111)
CO2: 26 mmol/L (ref 22–32)
CREATININE: 0.59 mg/dL (ref 0.44–1.00)
Calcium: 8.8 mg/dL — ABNORMAL LOW (ref 8.9–10.3)
Glucose, Bld: 103 mg/dL — ABNORMAL HIGH (ref 65–99)
POTASSIUM: 4 mmol/L (ref 3.5–5.1)
SODIUM: 137 mmol/L (ref 135–145)

## 2015-05-15 NOTE — ED Notes (Signed)
Pt states that she was diagnosed with an URI on Monday. States that she has been taking Azithromycin as prescribed and is feeling somewhat better but is steeling sweating, body aches and having chest pain. Also diagnosed with hyponatremia.

## 2015-05-15 NOTE — ED Provider Notes (Signed)
CSN: [id number]     Arrival date & time 05/15/15  1525 History  This chart was scribed for non-physician practitioner,. Kerrie Buffalo, NP working with Tilden Fossa, MD, by Jarvis Morgan, ED Scribe. This patient was seen in room TR11C/TR11C and the patient's care was started at 4:41 PM.     Chief Complaint  Patient presents with  . Chest Pain  . URI    Patient is a 24 y.o. female presenting with chest pain. The history is provided by the patient. No language interpreter was used.  Chest Pain Pain location:  L chest Pain quality: sharp   Pain radiates to:  Does not radiate Pain radiates to the back: no   Pain severity:  Mild Onset quality:  Sudden Duration:  1 hour Timing:  Intermittent Progression:  Resolved Chronicity:  New Context: at rest   Context: not breathing and not raising an arm   Associated symptoms: diaphoresis   Associated symptoms: no cough, no fever, no nausea, no shortness of breath and not vomiting     HPI Comments: Tammy Goodwin is a 24 y.o. female who presents to the Emergency Department complaining of constant, sudden onset, sharp left sided chest pain that began 1 hour ago. She reports associated diaphoresis and generalized myalgias. Pt notes she had 1 episode of chest pain begin while lying down 1 hour ago and admits she is not currently having the chest pain. Pt states she was diagnosed 4 days ago with a URI and was given amoxicillin prescription which she has been compliant with. Pt has not taken any other medications besides the amoxicillin for her symptoms.She reports that her URI symptoms are gradually improving. Pt endorses that she was told to report to the ER if her symptoms continued or if she developed chest pain. Pt reports she was previously running a fever of t-max 102 F, along with HA, congestion and cough. She notes that the chest pain is not reproduced with coughing. Pt reports that when she was dx with the URI they did blood work and she was told she  was anemic, and also had hyponatremia. She was told to take iron supplements drink Gatorade; which she states she has been compliant with.  She denies any fever at this time, SOB, nausea or vomiting.  Review of the patient's chart on line shows a Hgb on 9/5 of 8.4, HCT of 26.4 and WBC 7.8,  platelets 453  Patient reports having had anemia "all my life". She did have anemia that required transfusion x 2 units in 2013. She does not remember how low her hemoglobin was at that time. Patient had rectal bleeding and was dx with inflammatory bowel at that time. She was evaluated by GI and he had discussed need for surgery. She was in school and did not have time to continue the evaluation.  She reports not having been evaluated by her PCP since that time because she felt well and had no issues.   She has had a cholecystectomy and she does drink alcohol socially.   Past Medical History  Diagnosis Date  . IBS (irritable bowel syndrome)   . Anemia    Past Surgical History  Procedure Laterality Date  . Biopsy of right tibia    . Cholecystectomy     No family history on file. Social History  Substance Use Topics  . Smoking status: Never Smoker   . Smokeless tobacco: None  . Alcohol Use: 0.0 oz/week    0 Standard drinks  or equivalent per week     Comment: social   OB History    No data available     Review of Systems  Constitutional: Positive for diaphoresis. Negative for fever.  HENT: Negative for congestion.   Respiratory: Negative for cough and shortness of breath.   Cardiovascular: Positive for chest pain.  Gastrointestinal: Negative for nausea and vomiting.  Musculoskeletal: Positive for myalgias (generalized).  All other systems reviewed and are negative.     Allergies  Review of patient's allergies indicates no known allergies.  Home Medications   Prior to Admission medications   Medication Sig Start Date End Date Taking? Authorizing Provider  azithromycin (ZITHROMAX) 250  MG tablet Take 250 mg by mouth at bedtime.  05/13/15  Yes Historical Provider, MD  ibuprofen (ADVIL,MOTRIN) 200 MG tablet Take 200 mg by mouth every 6 (six) hours as needed for moderate pain.   Yes Historical Provider, MD  Multiple Vitamin (MULTIVITAMIN) tablet Take 1 tablet by mouth daily.   Yes Historical Provider, MD   Triage Vitals: BP 118/70 mmHg  Pulse 108  Temp(Src) 98.1 F (36.7 C) (Oral)  Resp 18  Ht 5\' 5"  (1.651 m)  Wt 125 lb (56.7 kg)  BMI 20.80 kg/m2  SpO2 100%  LMP 05/08/2015  Physical Exam  Constitutional: She is oriented to person, place, and time. She appears well-developed and well-nourished.  HENT:  Head: Normocephalic and atraumatic.  Right Ear: Tympanic membrane normal.  Left Ear: Tympanic membrane normal.  Nose: Nose normal.  Mouth/Throat: Uvula is midline, oropharynx is clear and moist and mucous membranes are normal.  Eyes: Conjunctivae and EOM are normal. Pupils are equal, round, and reactive to light.  Neck: Normal range of motion. Neck supple.  Cardiovascular: Regular rhythm and normal heart sounds.  Tachycardia present.   Pulses:      Radial pulses are 2+ on the right side, and 2+ on the left side.       Dorsalis pedis pulses are 2+ on the right side, and 2+ on the left side.  Pulmonary/Chest: Effort normal and breath sounds normal. No respiratory distress. She has no wheezes. She exhibits no tenderness.  No reproducible pain with lifting arms above head  Abdominal: Soft. There is no tenderness.  Genitourinary: Rectal exam shows no external hemorrhoid, no fissure, no mass, no tenderness and anal tone normal. Guaiac negative stool.  Musculoskeletal: Normal range of motion. She exhibits no edema.  No lower or upper extremity edema Full ROM of extremities No bilateral knee pain  Neurological: She is alert and oriented to person, place, and time. No cranial nerve deficit.  Skin: Skin is warm and dry.  Psychiatric: She has a normal mood and affect. Her  behavior is normal. Thought content normal.  Nursing note and vitals reviewed.   ED Course  Procedures (including critical care time) Labs, x-ray, orthostatic vs, EKG, cardiac monitoring  3:42 PM- Pt advised of plan for treatment and pt agrees. Will order CXR along with diagnostic lab work and 12 lead EKG.  Results for orders placed or performed during the hospital encounter of 05/15/15 (from the past 24 hour(s))  Basic metabolic panel     Status: Abnormal   Collection Time: 05/15/15  3:45 PM  Result Value Ref Range   Sodium 137 135 - 145 mmol/L   Potassium 4.0 3.5 - 5.1 mmol/L   Chloride 101 101 - 111 mmol/L   CO2 26 22 - 32 mmol/L   Glucose, Bld 103 (H) 65 -  99 mg/dL   BUN <5 (L) 6 - 20 mg/dL   Creatinine, Ser 9.56 0.44 - 1.00 mg/dL   Calcium 8.8 (L) 8.9 - 10.3 mg/dL   GFR calc non Af Amer >60 >60 mL/min   GFR calc Af Amer >60 >60 mL/min   Anion gap 10 5 - 15  CBC     Status: Abnormal   Collection Time: 05/15/15  3:45 PM  Result Value Ref Range   WBC 12.0 (H) 4.0 - 10.5 K/uL   RBC 3.64 (L) 3.87 - 5.11 MIL/uL   Hemoglobin 7.9 (L) 12.0 - 15.0 g/dL   HCT 21.3 (L) 08.6 - 57.8 %   MCV 70.9 (L) 78.0 - 100.0 fL   MCH 21.7 (L) 26.0 - 34.0 pg   MCHC 30.6 30.0 - 36.0 g/dL   RDW 46.9 (H) 62.9 - 52.8 %   Platelets 527 (H) 150 - 400 K/uL  POC occult blood, ED     Status: None   Collection Time: 05/15/15  6:31 PM  Result Value Ref Range   Fecal Occult Bld NEGATIVE NEGATIVE    Imaging Review Dg Chest 2 View  05/15/2015   CLINICAL DATA:  Upper respiratory tract infection. Shortness of breath with chest pain  EXAM: CHEST  2 VIEW  COMPARISON:  None.  FINDINGS: Lungs are clear. Heart size and pulmonary vascularity are normal. No adenopathy. No pneumothorax. No bone lesions.  IMPRESSION: No abnormality noted.   Electronically Signed   By: Bretta Bang III M.D.   On: 05/15/2015 16:06   I have personally reviewed and evaluated these images and lab results as part of my medical  decision-making.  EKG: sinus tachycardia  No STEMI Vent. Rate 110 PR Interval 12 ms QRS duration 66 ms QT/QTc 318/430 PRT axes 74  78  59   Read by Dr. Alden Server  I discussed this case with Dr. Madilyn Hook and will instruct patient on necessity of follow up with PCP or Hematology for her anemia.   MDM  24 y.o. female with one episode of sharp left upper chest pain earlier today that has not reoccurred. Stable for d/c without orthostatics, n/v, shortness of breath or other symptoms. BP 117/76 mmHg  Pulse 95  Temp(Src) 98.1 F (36.7 C) (Oral)  Resp 16  Ht 5\' 5"  (1.651 m)  Wt 125 lb (56.7 kg)  BMI 20.80 kg/m2  SpO2 100%  LMP 05/08/2015  I discussed with the patient in detail lab results, clinical and xray findings, taking vit C with her iron, and one a day vitamins, follow up with PCP for further evaluation of her anemia and need for immediate return if she develops shortness of breath, feeling weak or dizzy, chest pain or other problems. Patient voices understanding and agrees with plan.    Final diagnoses:  Nonspecific chest pain  Other iron deficiency anemias   I personally performed the services described in this documentation, which was scribed in my presence. The recorded information has been reviewed and is accurate.     [address] [place], NP 05/15/15 1921  Tilden Fossa, MD 05/16/15 0120

## 2015-05-15 NOTE — Discharge Instructions (Signed)
Your Hgb. Today is 7.9 and your platelets are 450. It is very important that you follow up with a primary care doctor for further evaluation. We are giving you the number for [facility] for follow up.  Be sure you are taking Vitamin C with your iron and take a one a day vitamin.   Return immediately if you feel weak and dizzy, have shortness of breath, persistent chest pain or other problems.

## 2015-10-28 IMAGING — DX DG CHEST 2V
2 series · 2 of 2 positions shown · non-contrast
Comparison: None.

CLINICAL DATA: Upper respiratory tract infection. Shortness of
breath with chest pain

EXAM:
CHEST  2 VIEW

[chest pa]
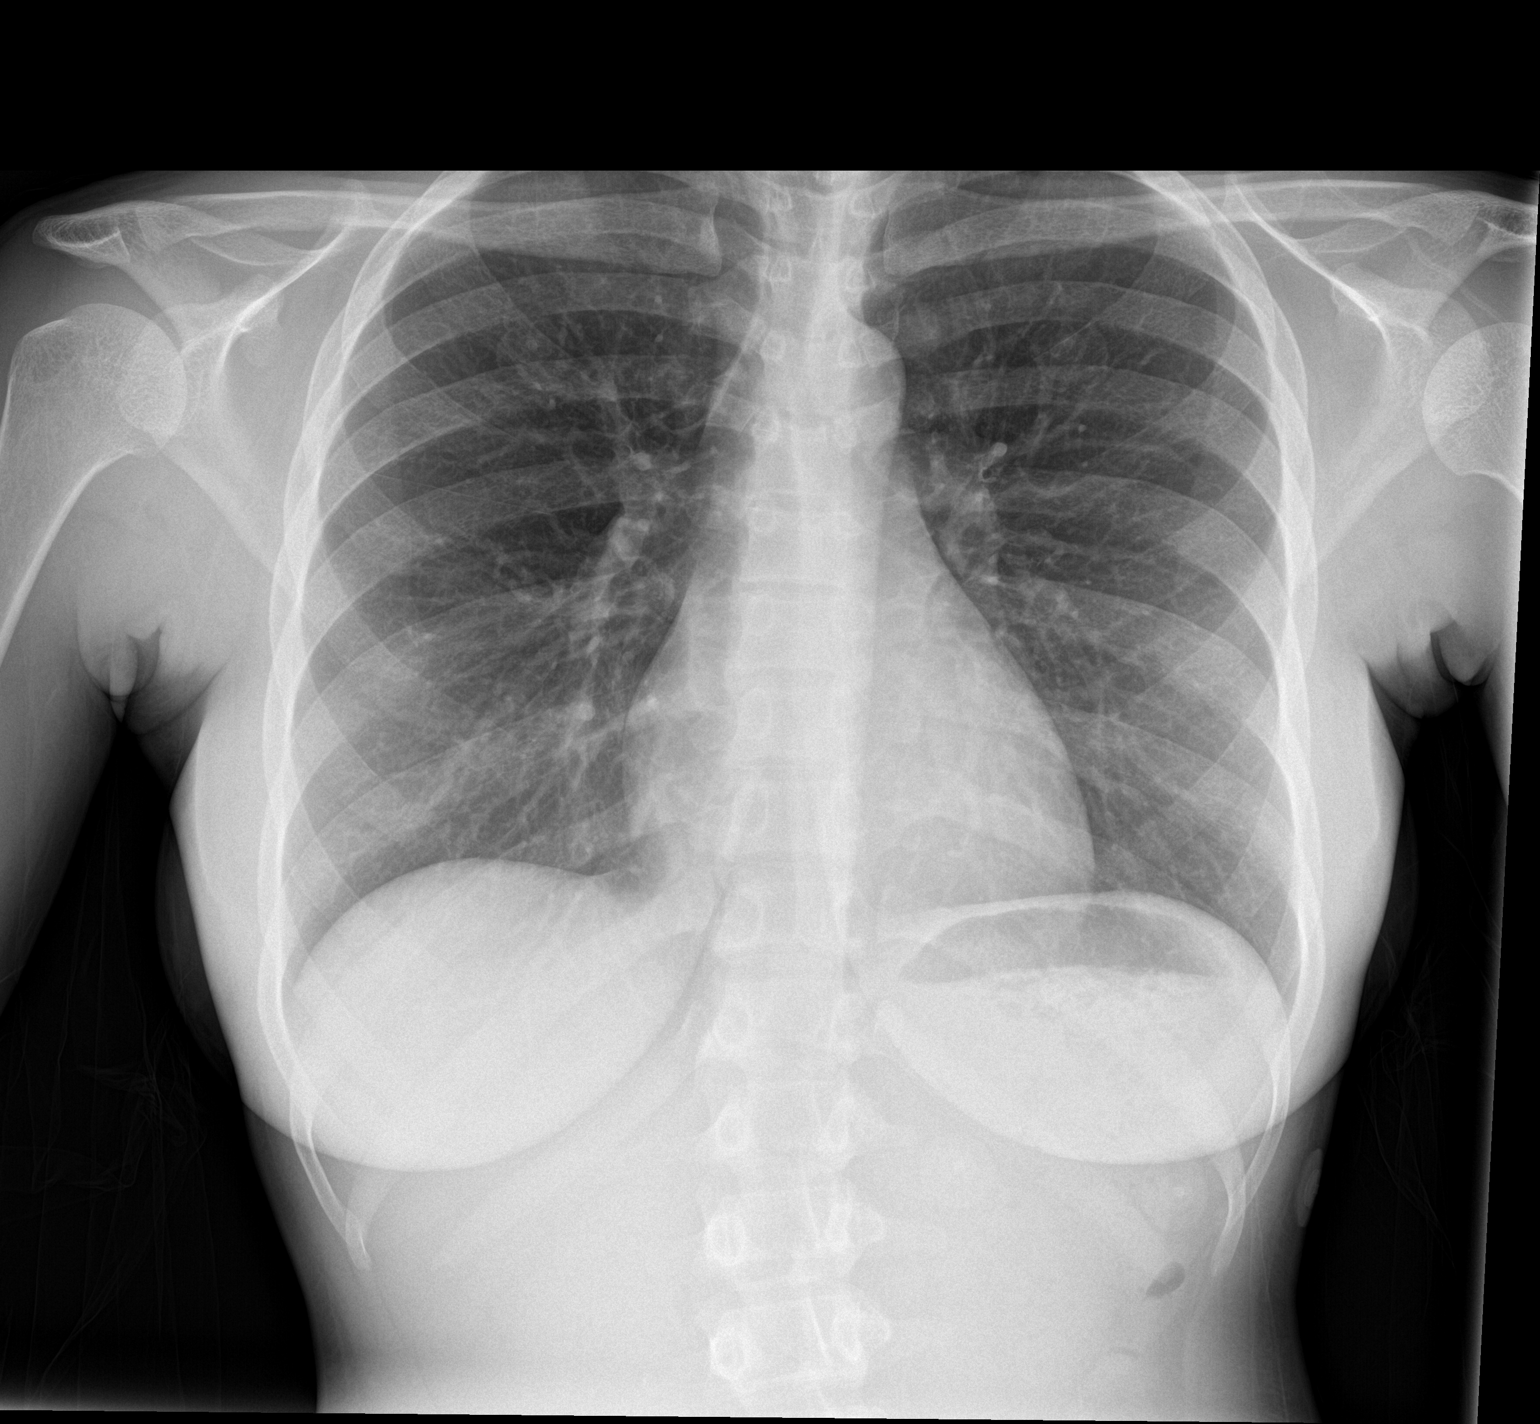

[chest lat]
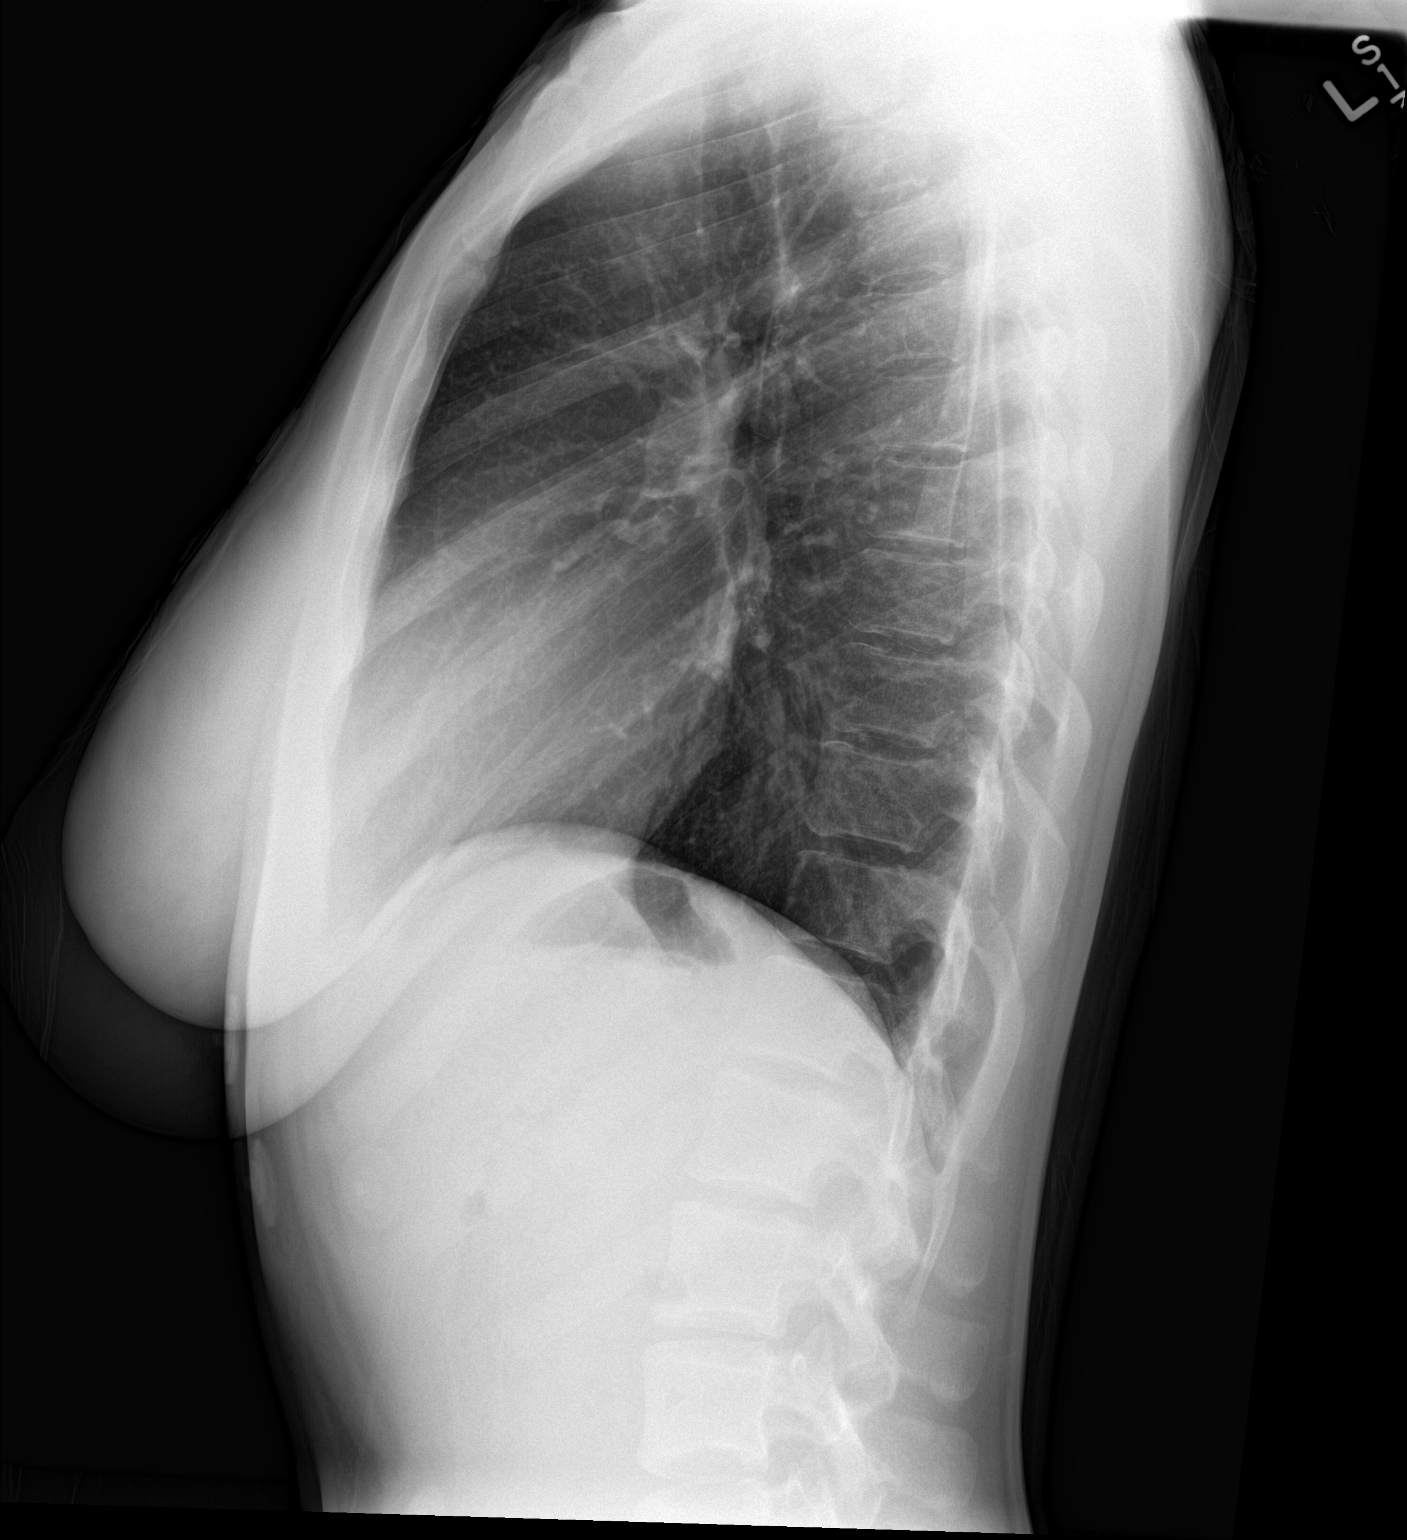

[2 of 2 positions shown; findings below may reference images not displayed]

FINDINGS: Lungs are clear. Heart size and pulmonary vascularity are normal. No
adenopathy. No pneumothorax. No bone lesions.
IMPRESSION: No abnormality noted.
